# Patient Record
Sex: Male | Born: 1980 | Race: White | Hispanic: No | Marital: Married | Smoking: Never smoker
Health system: Southern US, Community
[De-identification: ages and names within clinical notes are randomized; demographics above are authoritative.]

## PROBLEM LIST (undated history)

## (undated) HISTORY — PX: APPENDECTOMY: SHX54

## (undated) HISTORY — PX: TONSILLECTOMY: SUR1361

---

## 2009-04-18 ENCOUNTER — Emergency Department: Payer: Self-pay | Admitting: Emergency Medicine

## 2010-11-16 IMAGING — CT CT STONE STUDY
1 of 2 series · 15 of 32 positions shown, 19 images · non-contrast
Comparison: None

REASON FOR EXAM: right flank pain
COMMENTS:

PROCEDURE:     CT  - CT ABDOMEN /PELVIS WO (STONE)  - April 18, 2009  [DATE]
RESULT:     Indication: Right flank pain
TECHNIQUE: Multiple axial images from the lung bases to the symphysis pubis
were obtained without oral or intravenous contrast.

[Series 2: stone · axial · 0.76mm/px · z∈[-461,-29]mm · 15 of 164 slices shown, 19 images]
[im 13/164  soft-tissue]
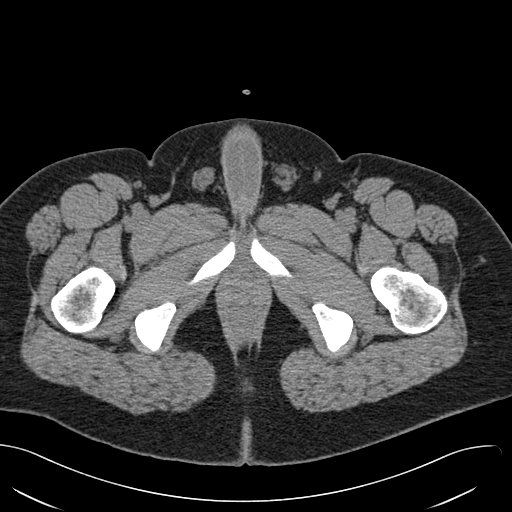
[im 13/164  bone]
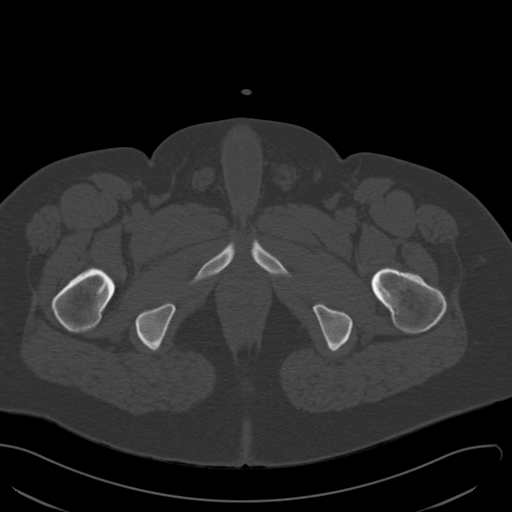
[im 25/164  soft-tissue]
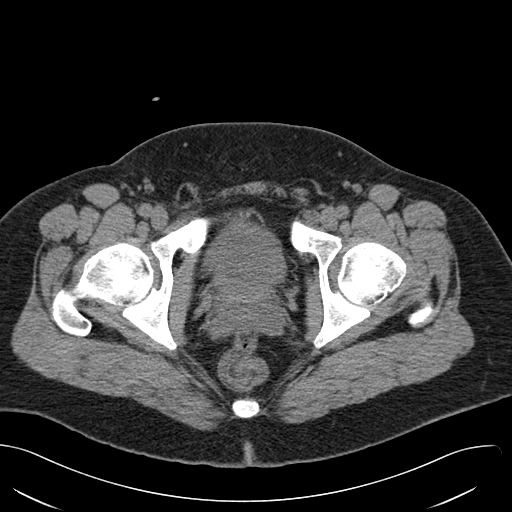
[im 37/164  soft-tissue]
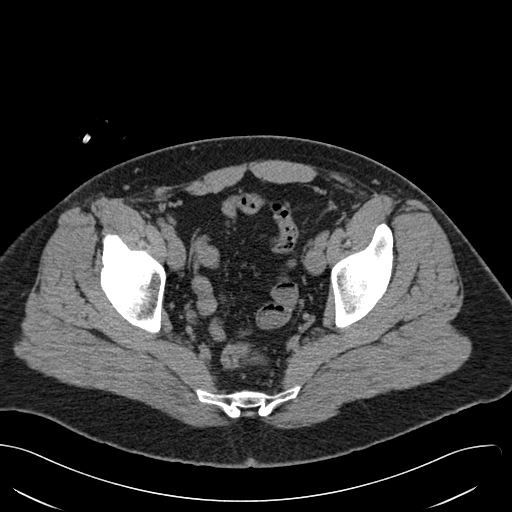
[im 49/164  soft-tissue]
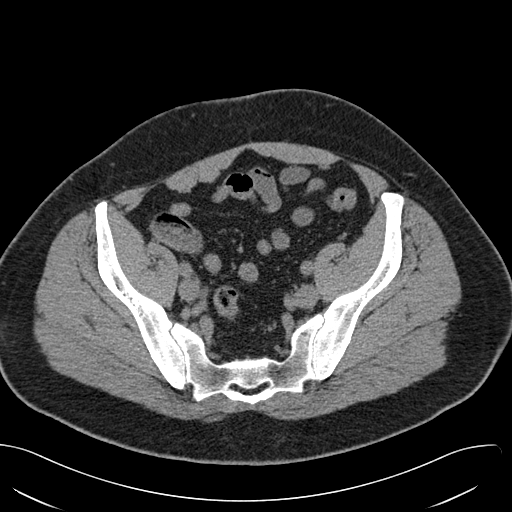
[im 61/164  soft-tissue]
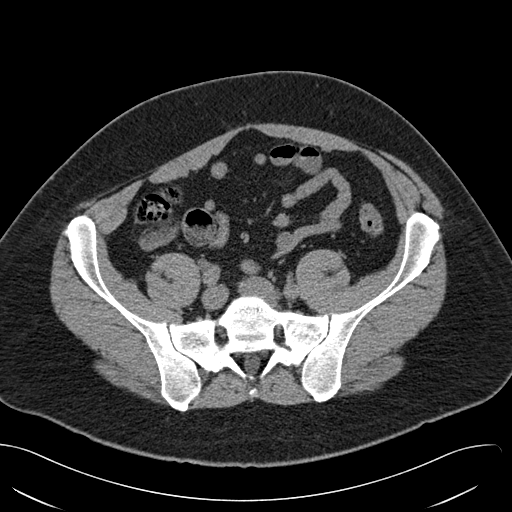
[im 73/164  soft-tissue]
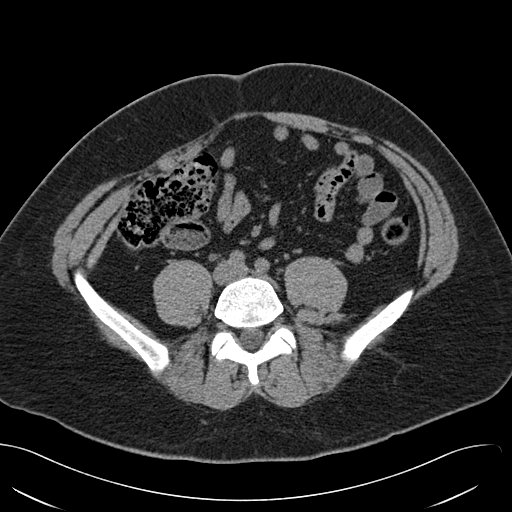
[im 85/164  soft-tissue]
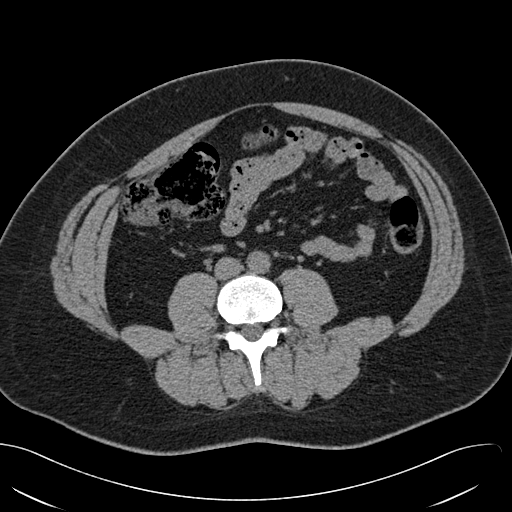
[im 97/164  soft-tissue]
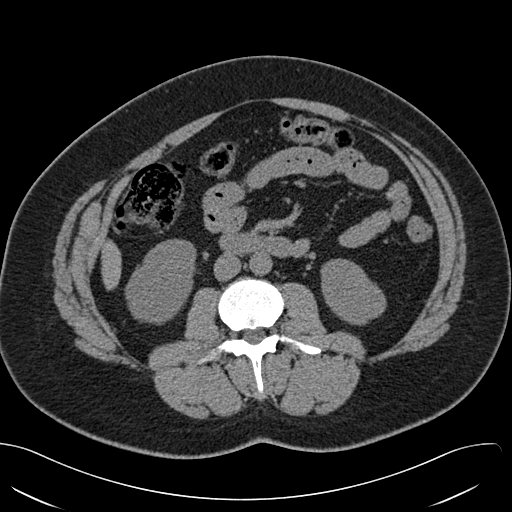
[im 109/164  soft-tissue]
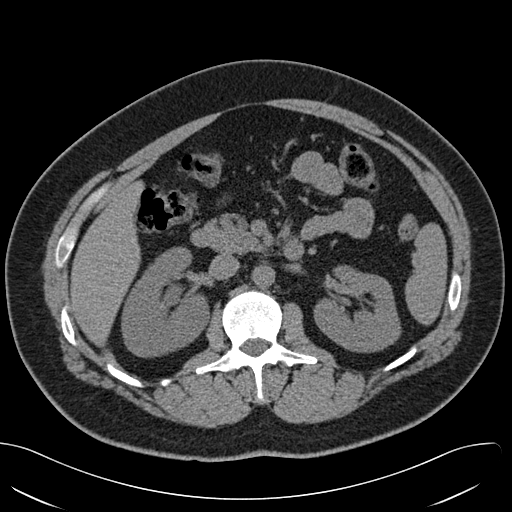
[im 109/164  bone]
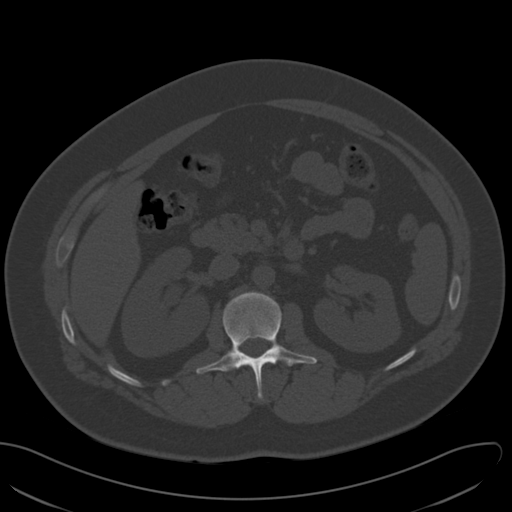
[im 121/164  soft-tissue]
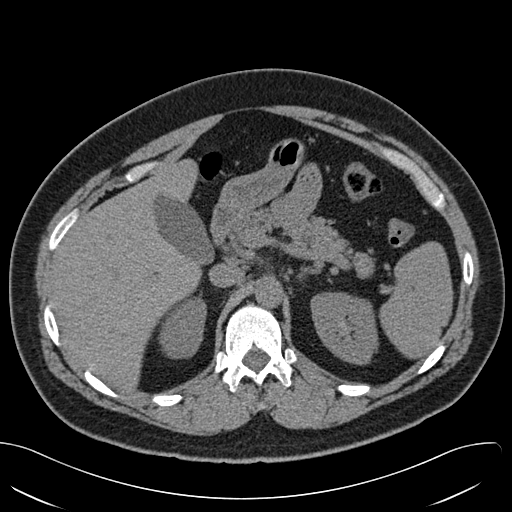
[im 133/164  soft-tissue]
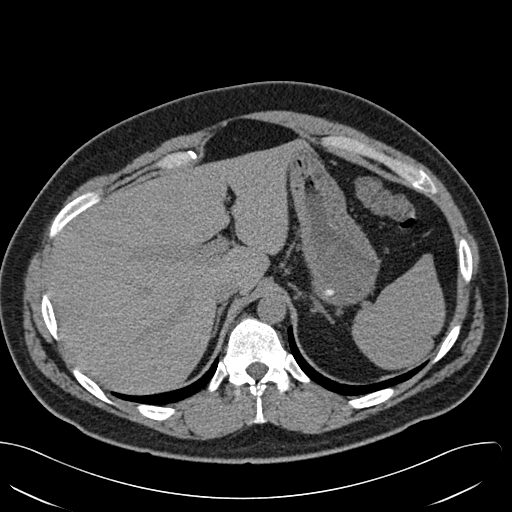
[im 139/164  lung]
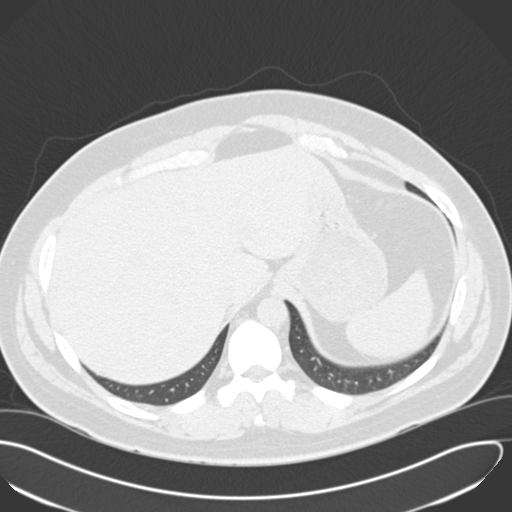
[im 145/164  soft-tissue]
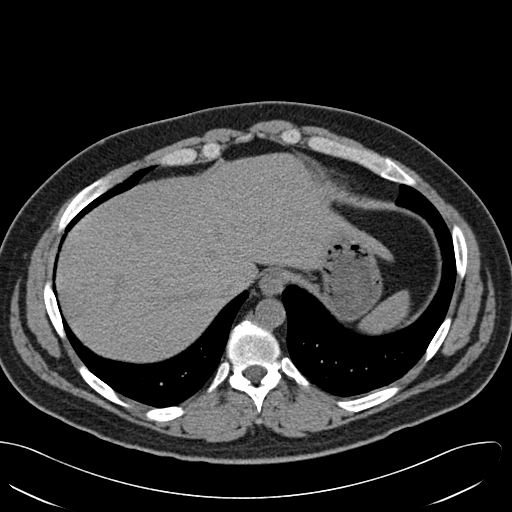
[im 145/164  lung]
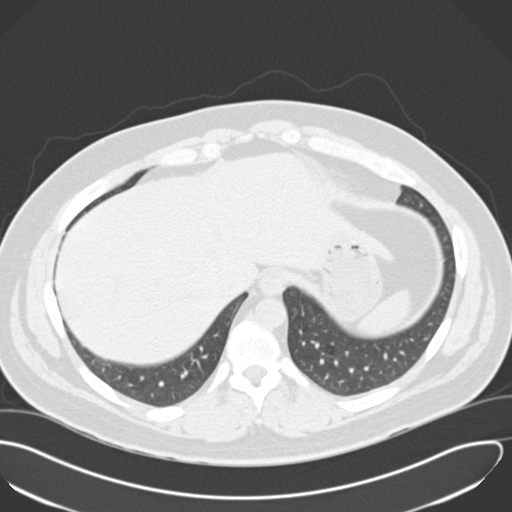
[im 151/164  lung]
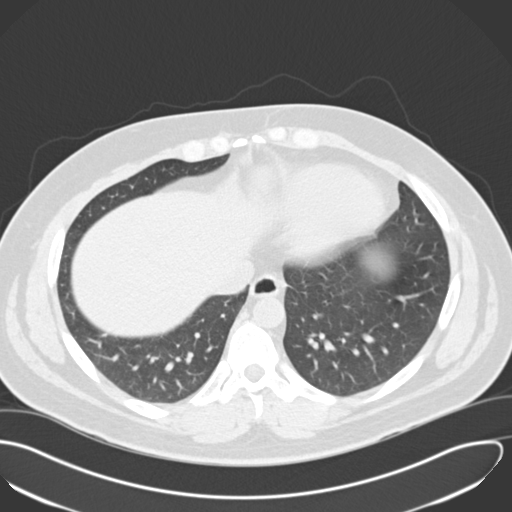
[im 157/164  soft-tissue]
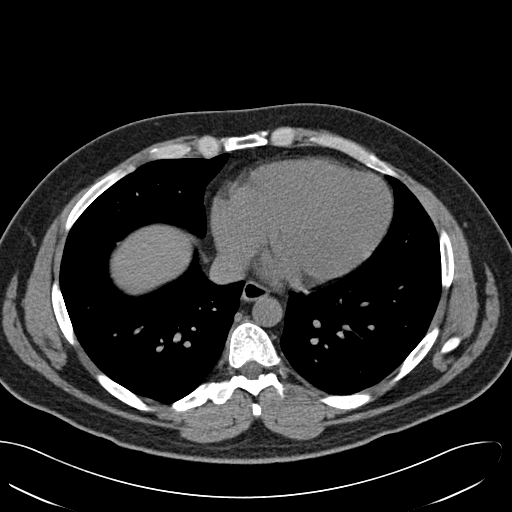
[im 157/164  lung]
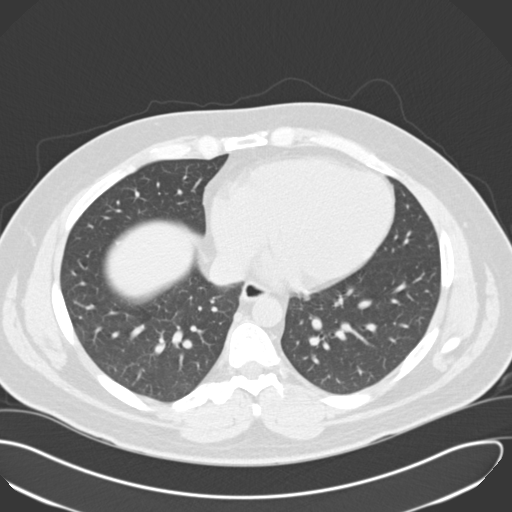

[15 of 32 positions shown; findings below may reference images not displayed]

FINDINGS: The lung bases are clear. There is no pleural or pericardial effusions.

There is a punctate 1 mm calculus in the right side of the bladder which may
be within the right UVJ versus within the bladder lumen. There is mild right
hydronephrosis. There is no other renal, ureteral, or bladder calculus. No
perinephric stranding is seen. The kidneys are symmetric in size without
evidence for exophytic mass. The bladder is unremarkable.

The liver demonstrates no focal abnormality. The gallbladder is
unremarkable. The spleen demonstrates no focal abnormality. The adrenal
glands and pancreas are normal.

The unopacified stomach, duodenum, small intestine, and large intestine are
unremarkable, but evaluation is limited by lack of oral contrast. There is
no pneumoperitoneum, pneumatosis, or portal venous gas. There is no
abdominal or pelvic free fluid. There is no lymphadenopathy.

The abdominal aorta is normal in caliber.

The osseous structures are unremarkable.
IMPRESSION: There is a punctate 1 mm calculus in the right side of the bladder which may
be within the right UVJ versus within the bladder lumen. There is mild right
hydronephrosis

## 2022-03-30 ENCOUNTER — Encounter: Payer: Self-pay | Admitting: Emergency Medicine

## 2022-03-30 ENCOUNTER — Ambulatory Visit
Admission: EM | Admit: 2022-03-30 | Discharge: 2022-03-30 | Disposition: A | Discharge status: Home / Self Care | Payer: Managed Care, Other (non HMO) | Attending: Internal Medicine | Admitting: Internal Medicine

## 2022-03-30 DIAGNOSIS — J020 Streptococcal pharyngitis: Secondary | ICD-10-CM | POA: Insufficient documentation

## 2022-03-30 LAB — GROUP A STREP BY PCR: Group A Strep by PCR: DETECTED — AB

## 2022-03-30 LAB — RAPID INFLUENZA A&B ANTIGENS
Influenza A (ARMC): NEGATIVE
Influenza B (ARMC): NEGATIVE

## 2022-03-30 MED ORDER — AMOXICILLIN 500 MG PO CAPS: 500.0000 mg | ORAL_CAPSULE | Freq: Two times a day (BID) | ORAL | 0 refills | Status: AC

## 2022-03-30 NOTE — ED Triage Notes (Signed)
Patient was diagnosed with COVID on 03/20/22.  Patient states that he started feeling better but started having bodyaches, chills, sore throat, and cough that started this morning.  Patient wants to be checked for flu.

## 2022-03-30 NOTE — Discharge Instructions (Addendum)
You were seen today for upper respiratory symptoms.  Your strep test was positive.  Your flu test was negative.  I have prescribed antibiotics to take twice daily for the next 10 days.  You may also take ibuprofen 600 to 800 mg every 8 hours as needed for throat pain and inflammation.  I encouraged rest and fluids.  Please follow-up if your symptoms persist or worsen.

## 2022-03-30 NOTE — ED Provider Notes (Signed)
[facility]    CSN: [id number] Arrival date & time: 03/30/22  1142      History   Chief Complaint Chief Complaint  Patient presents with   Generalized Body Aches   Sore Throat   Cough    HPI Jeremy Cohen is a 41 y.o. male presents to UC today with complaint of slight nasal congestion, sore throat, cough, chest congestion and shortness of breath.  He reports his symptoms started this morning.  He is not blowing much mucus out of his nose.  He is having some difficulty swallowing.  The cough is productive of yellow/brown mucus.  He denies headache, runny nose, ear pain, chest pain, nausea, vomiting or diarrhea.  He reports fever, chills and body aches.  He has tried Aleve OTC with minimal relief of symptoms.  He reports he had a positive home COVID test 02/2022, felt bad for a few days but did not seek any treatment for this.  He has not had sick contacts or known exposure to COVID/flu that he is aware of.  HPI  History reviewed. No pertinent past medical history.  There are no problems to display for this patient.   Past Surgical History:  Procedure Laterality Date   APPENDECTOMY     TONSILLECTOMY         Home Medications    Prior to Admission medications   Medication Sig Start Date End Date Taking? Authorizing Provider  amoxicillin (AMOXIL) 500 MG capsule Take 1 capsule (500 mg total) by mouth 2 (two) times daily. 03/30/22  Yes Lorre Munroe, NP  buPROPion (WELLBUTRIN XL) 300 MG 24 hr tablet  04/25/21  Yes [provider]  busPIRone (BUSPAR) 15 MG tablet Take 15 mg by mouth 2 (two) times daily.   Yes [provider]  montelukast (SINGULAIR) 10 MG tablet  04/25/21  Yes [provider]    Family History History reviewed. No pertinent family history.  Social History Social History   Tobacco Use   Smoking status: Never   Smokeless tobacco: Never  Vaping Use   Vaping Use: Never used  Substance Use Topics   Alcohol use: Yes    Drug use: Never     Allergies   Patient has no known allergies.   Review of Systems Review of Systems  Constitutional:  Positive for appetite change, chills, fatigue and fever.  HENT:  Positive for congestion, sore throat and trouble swallowing. Negative for ear pain and rhinorrhea.   Eyes:  Negative for pain and redness.  Respiratory:  Positive for cough and shortness of breath. Negative for chest tightness.   Cardiovascular:  Negative for chest pain and palpitations.  Gastrointestinal:  Negative for diarrhea, nausea and vomiting.  Musculoskeletal:  Positive for myalgias.  Skin:  Negative for rash.  Neurological:  Negative for dizziness, weakness and headaches.     Physical Exam Triage Vital Signs ED Triage Vitals  Enc Vitals Group     BP 03/30/22 1322 138/85     Pulse Rate 03/30/22 1322 100     Resp 03/30/22 1322 15     Temp 03/30/22 1322 100.1 F (37.8 C)     Temp Source 03/30/22 1322 Oral     SpO2 03/30/22 1322 100 %     Weight 03/30/22 1319 200 lb (90.7 kg)     Height 03/30/22 1319 5\' 10"  (1.778 m)     Head Circumference --      Peak Flow --      Pain  Score 03/30/22 1318 7     Pain Loc --      Pain Edu? --      Excl. in GC? --    No data found.  Updated Vital Signs BP 138/85 (BP Location: Right Arm)   Pulse 100   Temp 100.1 F (37.8 C) (Oral)   Resp 15   Ht 5\' 10"  (1.778 m)   Wt 200 lb (90.7 kg)   SpO2 100%   BMI 28.70 kg/m    Physical Exam Constitutional:      Appearance: He is ill-appearing.  HENT:     Head: Normocephalic.     Right Ear: Tympanic membrane and ear canal normal.     Left Ear: Tympanic membrane and ear canal normal.     Nose: Congestion present. No rhinorrhea.     Mouth/Throat:     Mouth: Mucous membranes are moist.     Pharynx: Posterior oropharyngeal erythema present. No oropharyngeal exudate.     Tonsils: No tonsillar exudate or tonsillar abscesses. 1+ on the right. 1+ on the left.  Eyes:     Conjunctiva/sclera:  Conjunctivae normal.     Pupils: Pupils are equal, round, and reactive to light.  Cardiovascular:     Rate and Rhythm: Regular rhythm. Tachycardia present.  Pulmonary:     Effort: Pulmonary effort is normal.     Breath sounds: Normal breath sounds. No wheezing, rhonchi or rales.  Lymphadenopathy:     Cervical: No cervical adenopathy.  Skin:    General: Skin is warm and dry.     Findings: No rash.  Neurological:     General: No focal deficit present.     Mental Status: He is alert and oriented to person, place, and time.      UC Treatments / Results  Labs  Labs Reviewed  GROUP A STREP BY PCR - Abnormal; Notable for the following components:      Result Value   Group A Strep by PCR DETECTED (*)    All other components within normal limits  RAPID INFLUENZA A&B ANTIGENS     Medications Ordered in UC Medications - No data to display  Initial Impression / Assessment and Plan / UC Course  I have reviewed the triage vital signs and the nursing notes.  Pertinent labs & imaging results that were available during my care of the patient were reviewed by me and considered in my medical decision making (see chart for details).     41 year old male with complaint of nasal congestion, sore throat, cough, chest congestion and shortness of breath x1 day.  DDx include viral URI with cough, viral pharyngitis, strep pharyngitis, influenza, COVID.  He had a positive home COVID test 02/2022 and he does not think that this needs to be repeated at this time.  Strep test positive.  Flu swab negative.  Will treat with Amoxicillin 500 mg BID x 10 days.  Encourage Ibuprofen 600 mg every 8 hours as needed for throat pain and inflammation.  Encouraged rest and fluids.  Final Clinical Impressions(s) / UC Diagnoses   Final diagnoses:  Strep pharyngitis     Discharge Instructions      You were seen today for upper respiratory symptoms.  Your strep test was positive.  Your flu test was negative.  I  have prescribed antibiotics to take twice daily for the next 10 days.  You may also take ibuprofen 600 to 800 mg every 8 hours as needed for throat pain and inflammation.  I encouraged rest and fluids.  Please follow-up if your symptoms persist or worsen.     ED Prescriptions     Medication Sig Dispense Auth. Provider   amoxicillin (AMOXIL) 500 MG capsule Take 1 capsule (500 mg total) by mouth 2 (two) times daily. 20 capsule Lorre Munroe, NP      PDMP not reviewed this encounter.   Lorre Munroe, NP 03/30/22 1404
